# Patient Record
Sex: Male | Born: 1970 | Race: White | Hispanic: Yes | Marital: Married | Smoking: Current some day smoker
Health system: Southern US, Community
[De-identification: ages and names within clinical notes are randomized; demographics above are authoritative.]

---

## 2000-08-19 ENCOUNTER — Encounter (INDEPENDENT_AMBULATORY_CARE_PROVIDER_SITE_OTHER): Payer: Self-pay | Admitting: Specialist

## 2000-08-19 ENCOUNTER — Encounter: Payer: Self-pay | Admitting: General Surgery

## 2000-08-19 ENCOUNTER — Inpatient Hospital Stay (HOSPITAL_COMMUNITY): Admission: EM | Admit: 2000-08-19 | Discharge: 2000-08-20 | Payer: Self-pay | Admitting: Psychology

## 2005-03-18 ENCOUNTER — Ambulatory Visit: Payer: Self-pay | Admitting: Family Medicine

## 2005-03-24 ENCOUNTER — Ambulatory Visit: Payer: Self-pay | Admitting: *Deleted

## 2005-05-02 ENCOUNTER — Ambulatory Visit: Payer: Self-pay | Admitting: Family Medicine

## 2005-06-20 ENCOUNTER — Ambulatory Visit: Payer: Self-pay | Admitting: Family Medicine

## 2005-08-15 ENCOUNTER — Ambulatory Visit: Payer: Self-pay | Admitting: Family Medicine

## 2005-08-28 ENCOUNTER — Ambulatory Visit (HOSPITAL_COMMUNITY): Admission: RE | Admit: 2005-08-28 | Discharge: 2005-08-28 | Payer: Self-pay | Admitting: Family Medicine

## 2005-09-09 ENCOUNTER — Ambulatory Visit: Payer: Self-pay | Admitting: Family Medicine

## 2006-03-16 ENCOUNTER — Ambulatory Visit: Payer: Self-pay | Admitting: Internal Medicine

## 2006-09-21 ENCOUNTER — Ambulatory Visit: Payer: Self-pay | Admitting: Family Medicine

## 2006-12-16 ENCOUNTER — Ambulatory Visit: Payer: Self-pay | Admitting: Internal Medicine

## 2006-12-16 LAB — CONVERTED CEMR LAB
Alkaline Phosphatase: 79 units/L (ref 39–117)
Basophils Absolute: 0 10*3/uL (ref 0.0–0.1)
Creatinine, Ser: 0.76 mg/dL (ref 0.40–1.50)
Eosinophils Absolute: 0.1 10*3/uL (ref 0.0–0.7)
Eosinophils Relative: 2 % (ref 0–5)
Glucose, Bld: 62 mg/dL — ABNORMAL LOW (ref 70–99)
HCT: 44.7 % (ref 39.0–52.0)
Lymphs Abs: 2.2 10*3/uL (ref 0.7–3.3)
MCV: 88 fL (ref 78.0–100.0)
Platelets: 318 10*3/uL (ref 150–400)
RDW: 13.2 % (ref 11.5–14.0)
Sodium: 140 meq/L (ref 135–145)
Total Bilirubin: 0.5 mg/dL (ref 0.3–1.2)
Total Protein: 7.5 g/dL (ref 6.0–8.3)

## 2006-12-24 ENCOUNTER — Ambulatory Visit (HOSPITAL_COMMUNITY): Admission: RE | Admit: 2006-12-24 | Discharge: 2006-12-24 | Payer: Self-pay | Admitting: Internal Medicine

## 2007-01-06 ENCOUNTER — Ambulatory Visit: Payer: Self-pay | Admitting: Internal Medicine

## 2007-01-06 ENCOUNTER — Encounter (INDEPENDENT_AMBULATORY_CARE_PROVIDER_SITE_OTHER): Payer: Self-pay | Admitting: *Deleted

## 2007-01-11 ENCOUNTER — Ambulatory Visit (HOSPITAL_COMMUNITY): Admission: RE | Admit: 2007-01-11 | Discharge: 2007-01-11 | Payer: Self-pay | Admitting: Internal Medicine

## 2007-03-11 ENCOUNTER — Ambulatory Visit (HOSPITAL_COMMUNITY): Admission: RE | Admit: 2007-03-11 | Discharge: 2007-03-11 | Payer: Self-pay | Admitting: Internal Medicine

## 2008-05-01 ENCOUNTER — Ambulatory Visit: Payer: Self-pay | Admitting: Internal Medicine

## 2008-05-11 ENCOUNTER — Ambulatory Visit: Payer: Self-pay | Admitting: Internal Medicine

## 2008-05-11 LAB — CONVERTED CEMR LAB: Chlamydia, Swab/Urine, PCR: NEGATIVE

## 2009-01-15 ENCOUNTER — Ambulatory Visit: Payer: Self-pay | Admitting: Internal Medicine

## 2009-03-05 ENCOUNTER — Ambulatory Visit: Payer: Self-pay | Admitting: Internal Medicine

## 2009-04-02 ENCOUNTER — Ambulatory Visit: Payer: Self-pay | Admitting: Internal Medicine

## 2009-04-02 ENCOUNTER — Encounter: Payer: Self-pay | Admitting: Family Medicine

## 2009-04-02 LAB — CONVERTED CEMR LAB
Basophils Relative: 1 % (ref 0–1)
CO2: 25 meq/L (ref 19–32)
Chloride: 104 meq/L (ref 96–112)
HDL: 52 mg/dL (ref >39)
Hemoglobin: 14.7 g/dL (ref 13.0–17.0)
LDL Cholesterol: 124 mg/dL — ABNORMAL HIGH (ref 0–99)
Lymphocytes Relative: 41 % (ref 12–46)
Lymphs Abs: 2.1 10*3/uL (ref 0.7–4.0)
MCHC: 33.6 g/dL (ref 30.0–36.0)
Monocytes Absolute: 0.4 10*3/uL (ref 0.1–1.0)
Monocytes Relative: 7 % (ref 3–12)
Neutro Abs: 2.3 10*3/uL (ref 1.7–7.7)
Potassium: 4.4 meq/L (ref 3.5–5.3)
RBC: 4.96 M/uL (ref 4.22–5.81)
Sed Rate: 2 mm/hr (ref 0–16)
Sodium: 140 meq/L (ref 135–145)
Total CHOL/HDL Ratio: 3.8
Triglycerides: 98 mg/dL (ref <150)
VLDL: 20 mg/dL (ref 0–40)
WBC: 5 10*3/uL (ref 4.0–10.5)

## 2009-04-09 ENCOUNTER — Ambulatory Visit: Payer: Self-pay | Admitting: Internal Medicine

## 2009-05-14 ENCOUNTER — Ambulatory Visit: Payer: Self-pay | Admitting: Internal Medicine

## 2009-05-14 LAB — CONVERTED CEMR LAB
Albumin: 4.7 g/dL (ref 3.5–5.2)
Alkaline Phosphatase: 84 units/L (ref 39–117)
BUN: 12 mg/dL (ref 6–23)
Glucose, Bld: 111 mg/dL — ABNORMAL HIGH (ref 70–99)
HDL: 50 mg/dL (ref >39)
LDL Cholesterol: 130 mg/dL — ABNORMAL HIGH (ref 0–99)
Potassium: 4.4 meq/L (ref 3.5–5.3)
Triglycerides: 101 mg/dL (ref <150)

## 2009-11-02 ENCOUNTER — Ambulatory Visit (HOSPITAL_COMMUNITY): Admission: RE | Admit: 2009-11-02 | Discharge: 2009-11-02 | Payer: Self-pay | Admitting: Internal Medicine

## 2009-11-02 ENCOUNTER — Ambulatory Visit: Payer: Self-pay | Admitting: Family Medicine

## 2010-05-12 ENCOUNTER — Encounter: Payer: Self-pay | Admitting: Internal Medicine

## 2010-09-06 NOTE — Op Note (Signed)
[place]. [facility]  Patient:    Eric Schmidt, Eric Schmidt                     MRN: [id number] Proc. Date: 08/20/00 Adm. Date:  [id number] Disc. Date: [id number] Attending:  Chevis Pretty S                           Operative Report  PREOPERATIVE DIAGNOSIS:  Acute appendicitis.  POSTOPERATIVE DIAGNOSIS:  Acute appendicitis.  OPERATION PERFORMED:  Laparoscopic appendectomy.  SURGEON:  Chevis Pretty, M.D.  ANESTHESIA:  General endotracheal.  DESCRIPTION OF PROCEDURE:  After informed consent was obtained, the patient was brought to the operating room and placed in supine position on the operating room table.  After adequate induction of general anesthesia, the patients abdomen was prepped with Betadine and draped in the usual sterile manner.  A small vertically oriented superumbilical incision was made with a 15 blade knife after infiltrating this area with 0.25% Marcaine.  This incision was carried down through the subcutaneous tissue bluntly using Kelly clamp and army-navy retractors until the linea alba was identified.  The linea alba was incised with a 15 blade knife and each side was grasped with Kocher clamps and elevated.  The preperitoneal space was probed bluntly with a hemostat until the peritoneum was opened and access was gained to the abdominal cavity.  A finger was inserted through this hole to palpate the anterior abdominal wall and no apparent adhesions were noted.  A 0 Vicryl pursestring suture was placed in the fascia around this hole.  A Hasson cannula was then placed through this opening and anchored with the previously placed Vicryl pursestring stitch.  The abdomen was insufflated with carbon dioxide without difficulty.  A laparoscope was placed through the Hasson cannula and on initial inspection of the abdomen there were no abnormalities. The patient was placed in Trendelenburg position and tilted a little bit with his left side down. At this point  in looking in the right lower quadrant, the edge of what appeared to be an inflamed appendix could be visualized.  Another small transversely oriented incision was made in the superpubic area just to the left of midline after infiltrating this area with 0.25% Marcaine.  A 12 mm port was placed through this incision bluntly into the abdominal cavity under direct vision without difficulty.  Another area was also infiltrated with 0.25% Marcaine just below the umbilicus and to the left of midline.  A small stab incision was made with a 15 blade knife and a 5 mm trocar was placed bluntly through this incision into the abdominal cavity again under direct vision without difficulty.  A Glassman grasper was placed through the 12 mm trocar and used to elevate the appendix which did appear to be very large and inflamed, towards the anterior abdominal wall.  A Harmonic scalpel was placed through the 5 mm port and used to divide the mesoappendix.  This was done until the base of the appendix and cecum were identified and cleared of any fatty tissue.  At this point a Kingsley Spittle was placed through the 5 mm port and used to hold the appendix elevated towards the anterior abdominal wall.  An endoscopic stapler was then placed through the 12 mm port. The stapler was placed across the base of the appendix where it joins with the cecum. Visualization of this was excellent.  The stapler was then clamped and fired, stapling and  dividing the base of the appendix.  The endoscopic stapler was then removed and an endoscopic bag was placed through the 12 mm port. The appendix was placed within the endoscopic bag.  The camera was then moved to the 12 mm port and a gallbladder grasper was placed through the Hasson cannula and used to grasp the bag and remove it with the Hasson cannula through the superumbilical port.  The Hasson cannula was then replaced and the abdomen was inspected.  The appendiceal stump was hemostatic  and the staple line was intact and looked very good.  The abdomen was then irrigated with copious amounts of saline until the effluent was clear.  The ports were then all removed under direct vision and were all hemostatic.  The fascia of the superumbilical port was closed with the previously placed Vicryl pursestring suture.  The skin incisions were closed with interrupted 4-0 Monocryl subcuticular stitches.  Benzoin and Steri-Strips were applied.  The patient tolerated the procedure well.  At the end of the case all needle, sponge and instrument counts were correct.  The patient was awakened and taken to the recovery room in stable condition. DD:  08/20/00 TD:  08/20/00 Job: [id number]

## 2011-11-11 IMAGING — CR DG ANKLE COMPLETE 3+V*R*
3 series · 3 of 3 positions shown · non-contrast
Comparison: None.

CLINICAL DATA: History of ankle injury.  Pain.

RIGHT ANKLE - COMPLETE 3+ VIEW

[view not recorded (1 of 3)]
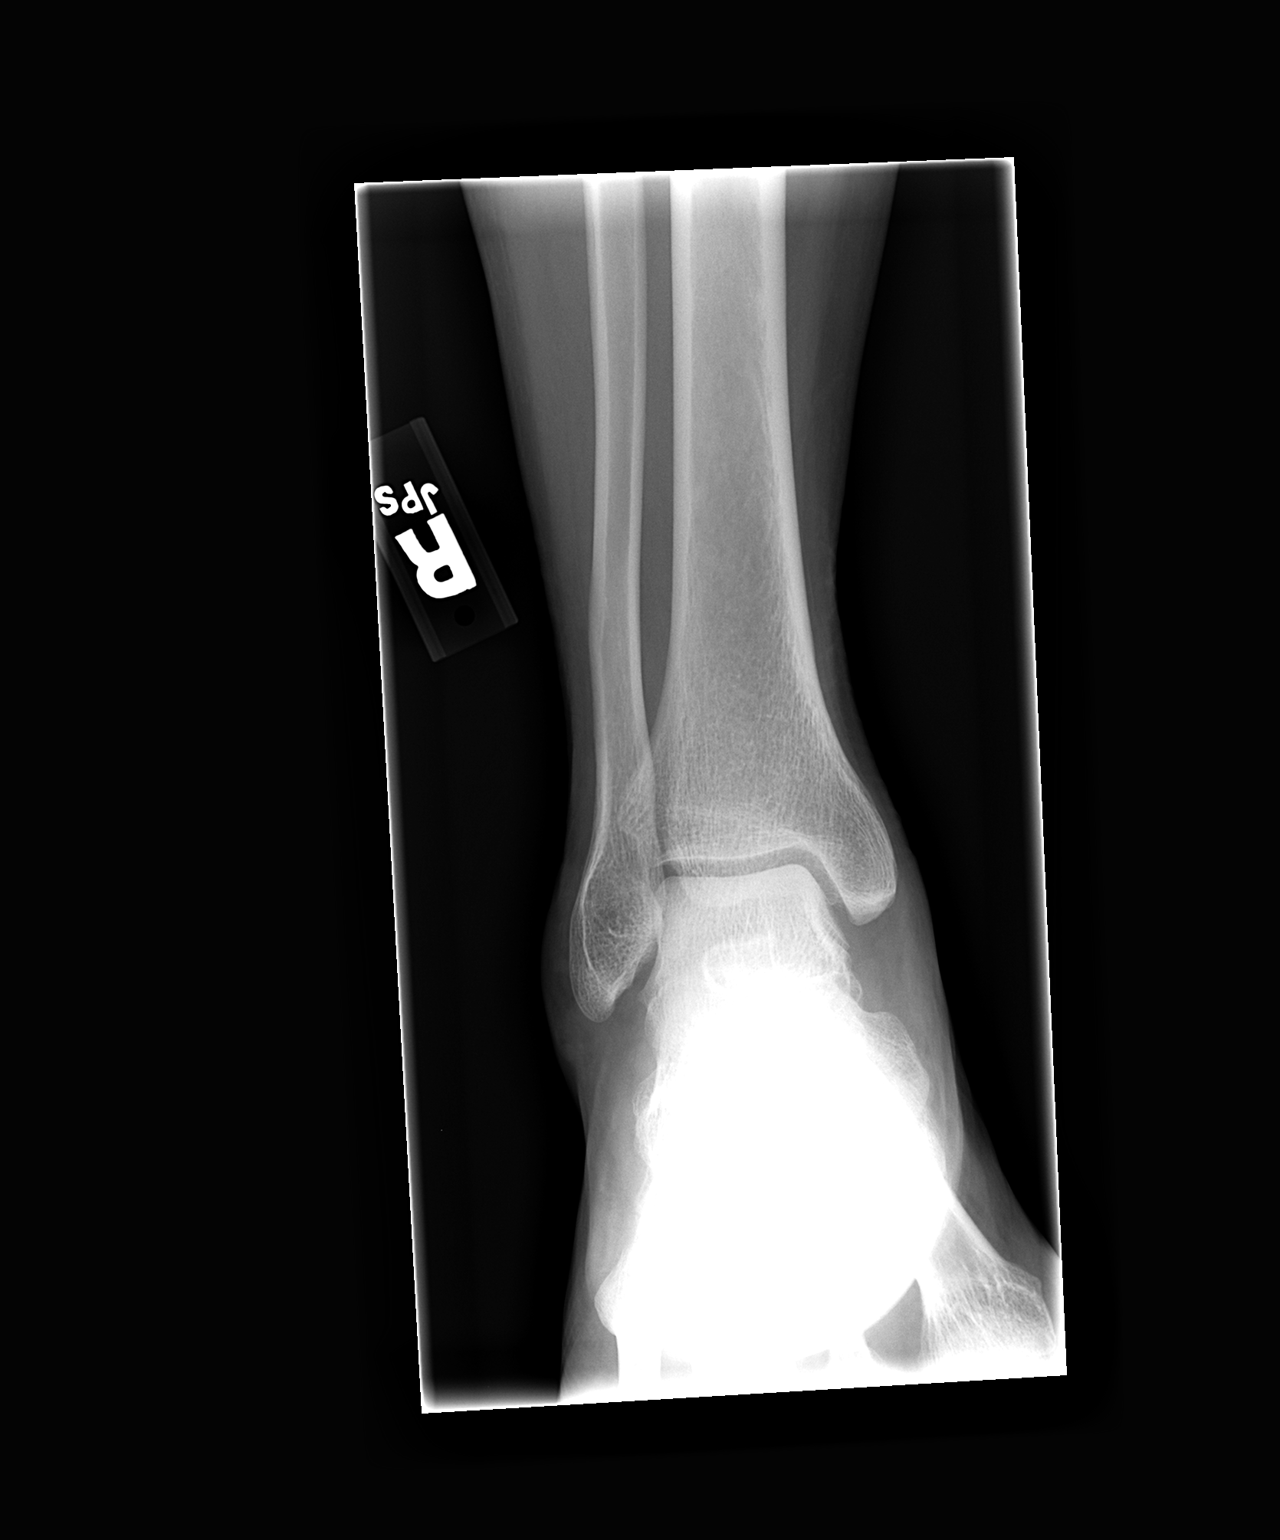

[view not recorded (2 of 3)]
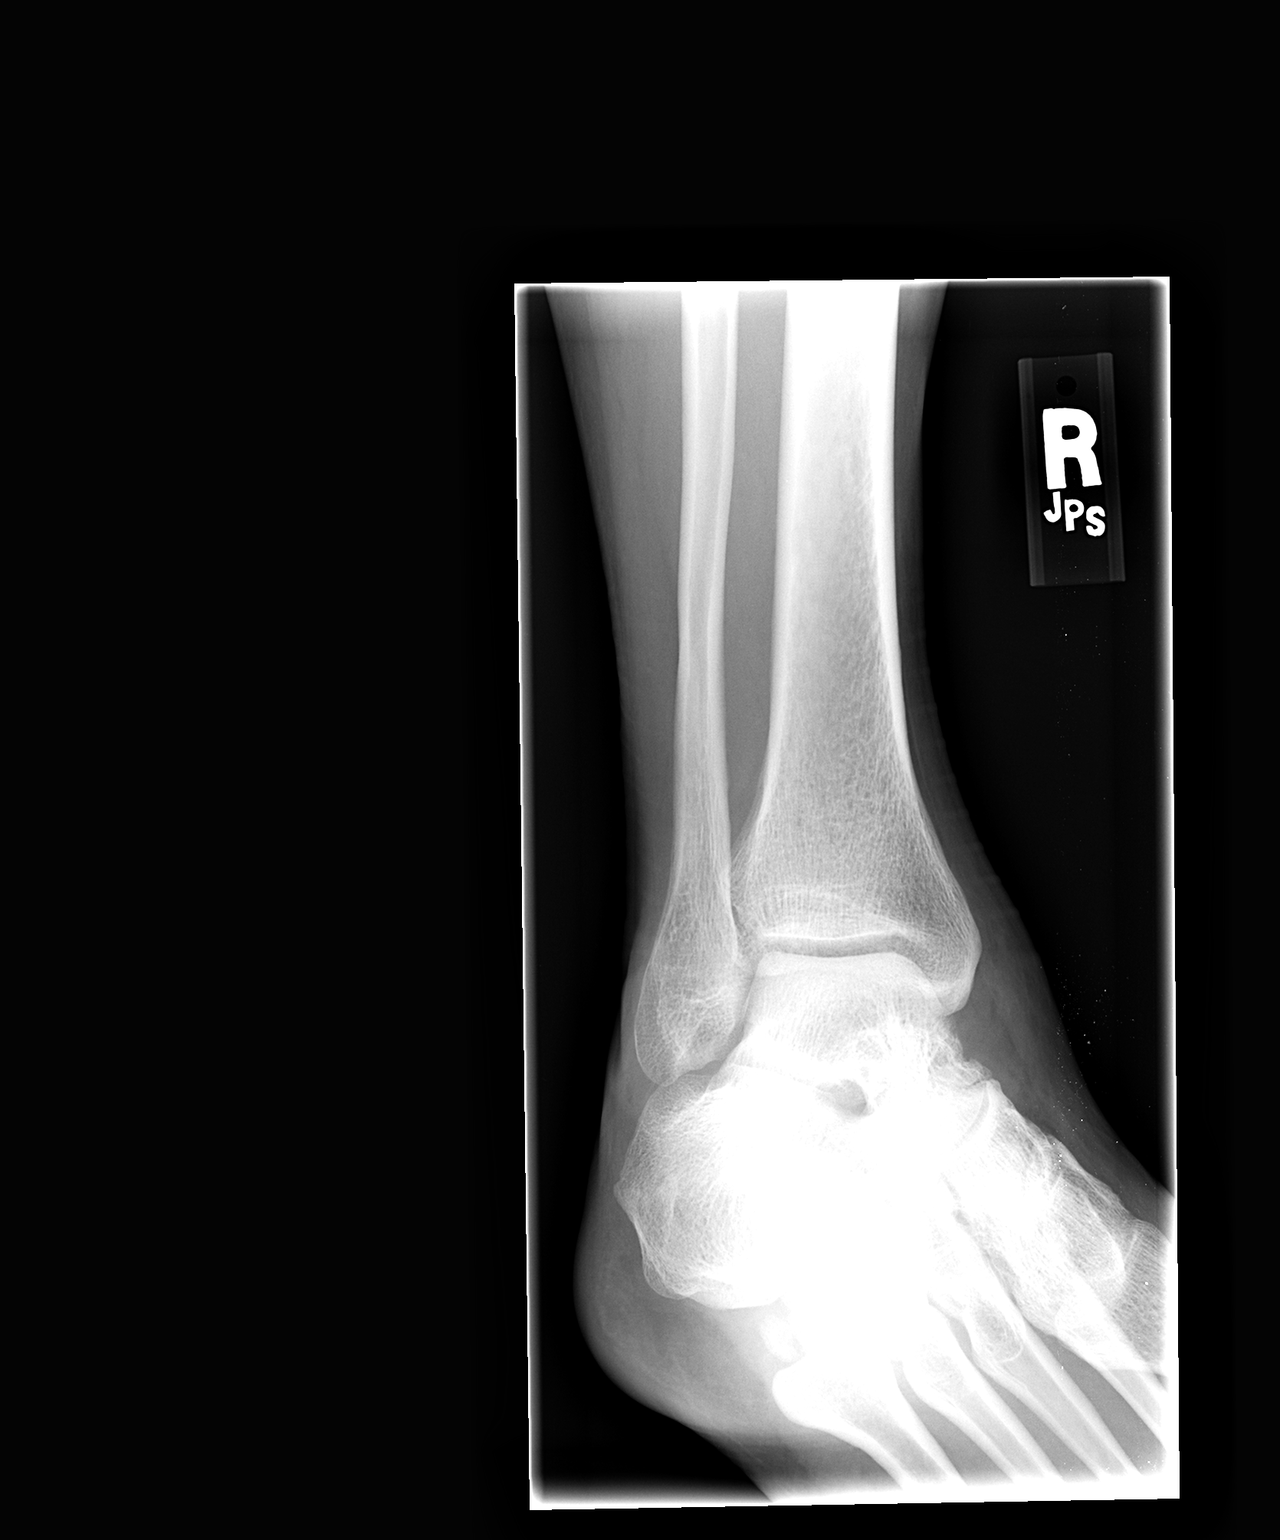

[view not recorded (3 of 3)]
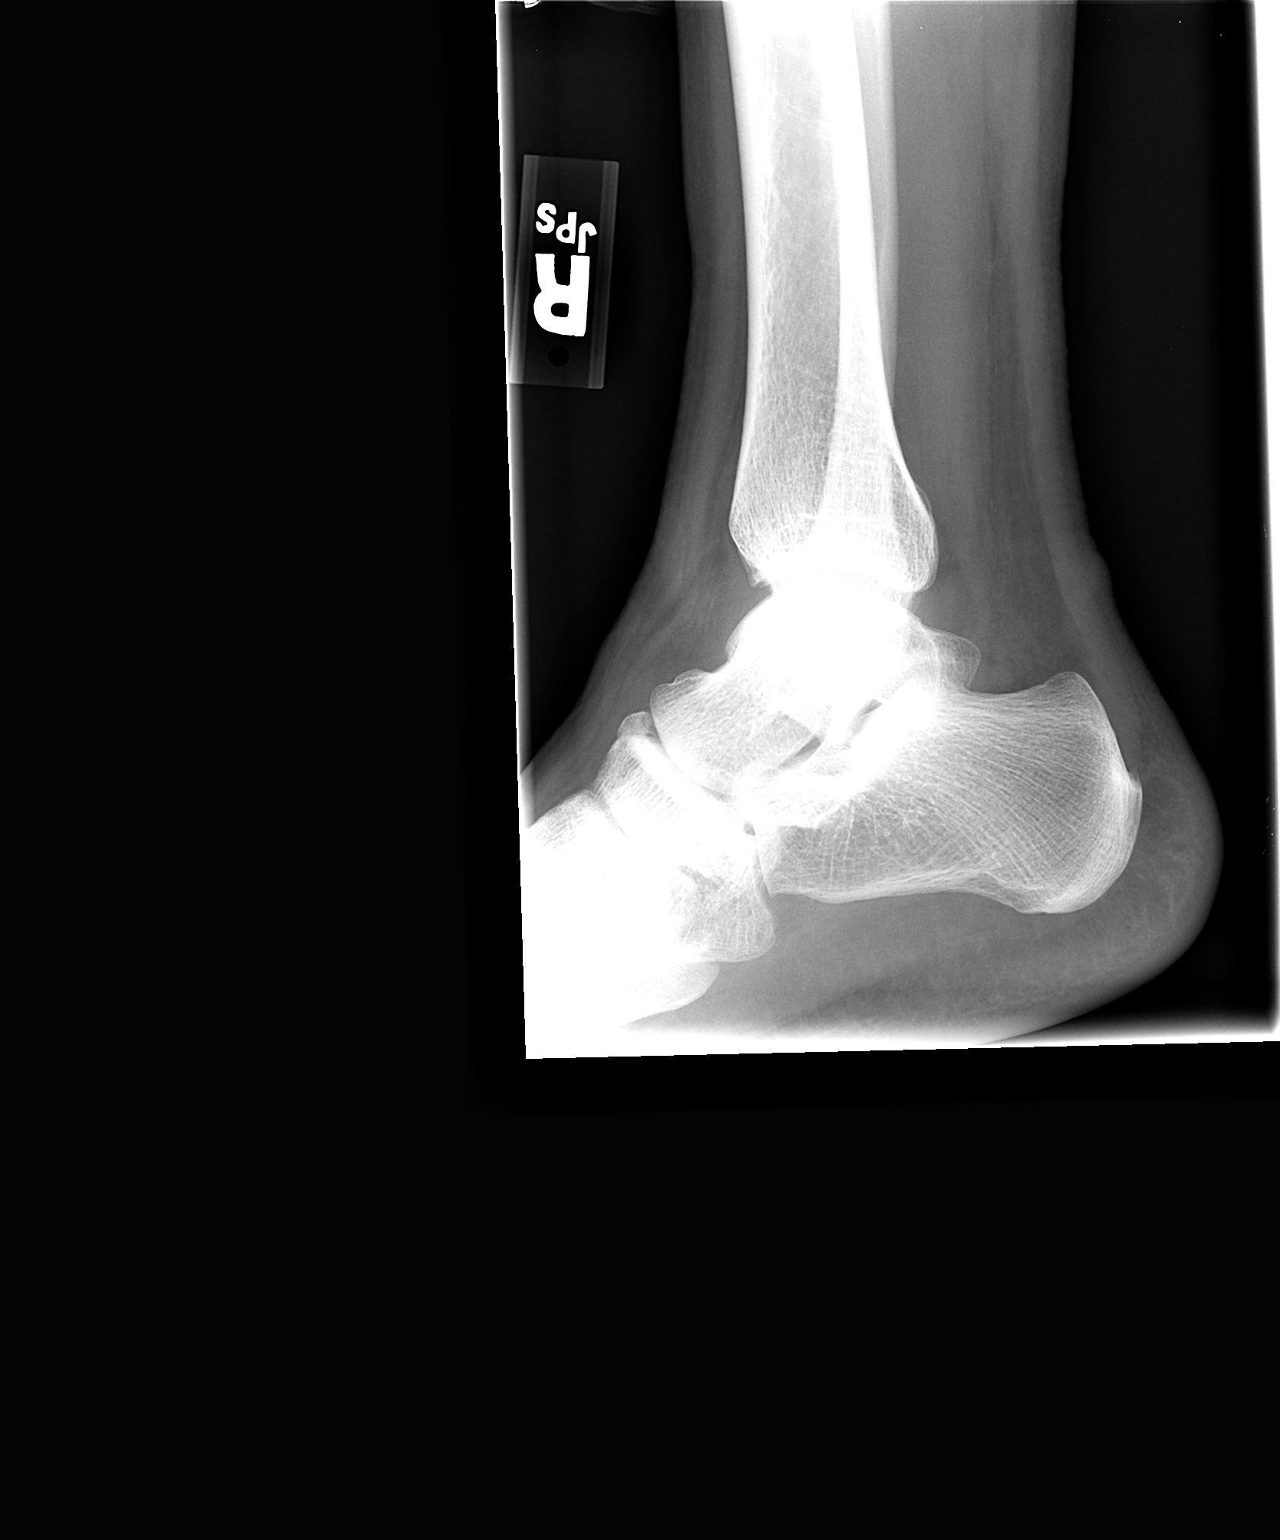

[3 of 3 positions shown; findings below may reference images not displayed]

FINDINGS: Mortise is preserved.  There is minimal degenerative
spurring of the distal anterior aspect of the tibia and the dorsal
proximal aspect of navicula. Alignment is normal.  Joint spaces are
preserved.  No fracture or dislocation is evident.  There is mild
soft tissue swelling.
IMPRESSION: No fracture or dislocation is evident.  Degenerative spurring as
described above.

## 2020-06-11 ENCOUNTER — Other Ambulatory Visit: Payer: Self-pay

## 2020-06-11 ENCOUNTER — Ambulatory Visit: Payer: Self-pay | Admitting: Physician Assistant

## 2020-06-11 VITALS — BP 131/87 | HR 76 | Temp 98.8°F | Resp 18 | Ht 65.0 in | Wt 163.0 lb

## 2020-06-11 DIAGNOSIS — L309 Dermatitis, unspecified: Secondary | ICD-10-CM

## 2020-06-11 MED ORDER — NYSTATIN-TRIAMCINOLONE 100000-0.1 UNIT/GM-% EX OINT: 1.0000 "application " | TOPICAL_OINTMENT | Freq: Two times a day (BID) | CUTANEOUS | 0 refills | Status: AC

## 2020-06-11 NOTE — Progress Notes (Signed)
Patient reports bruised areas on his skin 1 year ago on his left buttocks. Patient shares over the past few months he has noticed the abrasions show on his face and bilaterally on his arms. Patient has taken [facility] powder for body aches.

## 2020-06-11 NOTE — Patient Instructions (Signed)
You will use the cream on the spots twice a day until resolved.  I encourage you to not pick at them.  We will call you to schedule a follow-up appointment in 2 weeks for fasting labs and follow-up, and to help you with an appointment for financial assistance.  Please let us know there is anything else we can do for you in the meantime  Roney Jaffe, PA-C Physician Assistant [facility] [email or web address]    Tinea Versicolor  Tinea versicolor is a common fungal infection of the skin. It causes a rash that appears as light or dark patches on the skin. The rash most often occurs on the chest, back, neck, or upper arms. This condition is more common during warm weather. Other than affecting how your skin looks, tinea versicolor usually does not cause other problems. In most cases, the infection goes away in a few weeks with treatment. It may take a few months for the patches on your skin to return to your usual skin color. What are the causes? This condition occurs when a type of fungus that is normally present on the skin starts to overgrow. This fungus is a kind of yeast. The exact cause of the overgrowth is not known. This condition cannot be passed from one person to another (it is not contagious). What increases the risk? This condition is more likely to develop when certain factors are present, such as:  Heat and humidity.  Sweating too much.  Hormone changes.  Oily skin.  A weak disease-fighting system (immunesystem). What are the signs or symptoms? Symptoms of this condition include:  A rash of light or dark patches on your skin. The rash may have: ? Patches of tan or pink spots (on light skin). ? Patches of white or brown spots (on dark skin). ? Patches of skin that do not tan. ? Well-marked edges. ? Scales on the discolored areas.  Mild itching. How is this diagnosed? A health care provider can usually  diagnose this condition by looking at your skin. During the exam, he or she may use ultraviolet (UV) light to see how much of your skin has been affected. In some cases, a skin sample may be taken by scraping the rash. This sample will be viewed under a microscope to check for yeast overgrowth. How is this treated? Treatment for this condition may include:  Dandruff shampoo that is applied to the affected skin during showers or bathing.  Over-the-counter medicated skin cream, lotion, or soaps.  Prescription antifungal medicine in the form of skin cream or pills.  Medicine to help reduce itching. Follow these instructions at home:  Take over-the-counter and prescription medicines only as told by your health care provider.  Apply dandruff shampoo to the affected area if your health care provider told you to do that. You may be instructed to scrub the affected skin for several minutes each day.  Do not scratch the affected area of skin.  Avoid hot and humid conditions.  Do not use tanning booths.  Try to avoid sweating a lot. Contact a health care provider if:  Your symptoms get worse.  You have a fever.  You have redness, swelling, or pain at the site of your rash.  You have fluid or blood coming from your rash.  Your rash feels warm to the touch.  You have pus or a bad smell coming from your rash.  Your rash returns (recurs) after treatment. Summary  Tinea versicolor is a  common fungal infection of the skin. It causes a rash that appears as light or dark patches on the skin.  The rash most often occurs on the chest, back, neck, or upper arms.  A health care provider can usually diagnose this condition by looking at your skin.  Treatment may include applying shampoo to the skin and taking or applying medicines. This information is not intended to replace advice given to you by your health care provider. Make sure you discuss any questions you have with your health care  provider. Document Revised: 02/01/2020 Document Reviewed: 02/01/2020 Elsevier Patient Education  2021 [employer].

## 2020-06-11 NOTE — Progress Notes (Signed)
New Patient Office Visit  Subjective:  Patient ID: Eric Schmidt, male    DOB: 1970-07-27  Age: 50 y.o. MRN: [id number]  CC:  Chief Complaint  Patient presents with  . Abrasion    HPI Eric Schmidt reports that he started having spots on his forearms and a couple on his left leg and one on his left buttock.  Reports these have been present for the past year, states that he has used cortisone cream with some relief.  Reports that he does pick at them, although he denies pruritus.  Denies purulent, tenderness.  Denies any new detergents, lotions, fragrances, body washes, pets.  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   History reviewed. No pertinent past medical history.  History reviewed. No pertinent surgical history.  History reviewed. No pertinent family history.  Social History   Socioeconomic History  . Marital status: Married    Spouse name: Not on file  . Number of children: Not on file  . Years of education: Not on file  . Highest education level: Not on file  Occupational History  . Not on file  Tobacco Use  . Smoking status: Current Some Day Smoker  . Smokeless tobacco: Never Used  Substance and Sexual Activity  . Alcohol use: Not on file  . Drug use: Not on file  . Sexual activity: Not on file  Other Topics Concern  . Not on file  Social History Narrative  . Not on file   Social Determinants of Health   Financial Resource Strain: Not on file  Food Insecurity: Not on file  Transportation Needs: Not on file  Physical Activity: Not on file  Stress: Not on file  Social Connections: Not on file  Intimate Partner Violence: Not on file    ROS Review of Systems  Constitutional: Negative for chills and fever.  HENT: Negative.   Eyes: Negative.   Respiratory: Negative.   Cardiovascular: Negative.   Gastrointestinal: Negative.   Endocrine: Negative.   Genitourinary: Negative.   Musculoskeletal:  Negative.   Skin: Positive for rash.  Allergic/Immunologic: Negative.   Neurological: Negative.   Hematological: Negative.   Psychiatric/Behavioral: Negative.     Objective:   Today's Vitals: BP 131/87 (BP Location: Left Arm, Patient Position: Sitting, Cuff Size: Large)   Pulse 76   Temp 98.8 F (37.1 C) (Oral)   Resp 18   Ht 5\' 5"  (1.651 m)   Wt 163 lb (73.9 kg)   SpO2 99%   BMI 27.12 kg/m   Physical Exam Vitals and nursing note reviewed.  Constitutional:      Appearance: Normal appearance.  HENT:     Head: Atraumatic.     Right Ear: External ear normal.     Left Ear: External ear normal.     Nose: Nose normal.     Mouth/Throat:     Mouth: Mucous membranes are moist.     Pharynx: Oropharynx is clear.  Eyes:     Extraocular Movements: Extraocular movements intact.     Conjunctiva/sclera: Conjunctivae normal.     Pupils: Pupils are equal, round, and reactive to light.  Cardiovascular:     Rate and Rhythm: Normal rate.     Pulses: Normal pulses.  Pulmonary:     Effort: Pulmonary effort is normal.     Breath sounds: Normal breath sounds.  Musculoskeletal:        General: Normal range of motion.  Cervical back: Normal range of motion and neck supple.  Skin:    General: Skin is warm.     Comments: See photo of forearms   Neurological:     General: No focal deficit present.     Mental Status: He is alert and oriented to person, place, and time.  Psychiatric:        Mood and Affect: Mood normal.        Behavior: Behavior normal.        Thought Content: Thought content normal.        Judgment: Judgment normal.       Assessment & Plan:     1. Dermatitis Trial Mycolog, patient education given, patient to return to mobile unit in 2 weeks for follow-up and full physical.  Patient given information on [place] financial assistance, and will be given appointment to establish with primary care at that time. - nystatin-triamcinolone ointment (MYCOLOG); Apply  1 application topically 2 (two) times daily.  Dispense: 30 g; Refill: 0   I have reviewed the patient's medical history (PMH, PSH, Social History, Family History, Medications, and allergies) , and have been updated if relevant. I spent 30 minutes reviewing chart and  face to face time with patient.     Follow-up: Return in about 2 weeks (around 06/25/2020).   Kasandra Knudsen Mayers, PA-C

## 2020-06-12 ENCOUNTER — Telehealth: Payer: Self-pay

## 2020-06-12 DIAGNOSIS — L309 Dermatitis, unspecified: Secondary | ICD-10-CM | POA: Insufficient documentation

## 2020-06-12 NOTE — Telephone Encounter (Signed)
Reached out by phone to schedule 2 wk f/u and CAFA , OC appt @ [facility]. Patient is aware the MMU schedule has not been published and will be in the upcoming weeks.

## 2020-06-20 ENCOUNTER — Ambulatory Visit: Payer: Self-pay

## 2020-06-25 ENCOUNTER — Ambulatory Visit: Payer: Self-pay

## 2023-05-24 ENCOUNTER — Encounter (HOSPITAL_COMMUNITY): Payer: Self-pay | Admitting: *Deleted

## 2023-05-24 ENCOUNTER — Emergency Department (HOSPITAL_COMMUNITY)
Admission: EM | Admit: 2023-05-24 | Discharge: 2023-05-24 | Disposition: A | Discharge status: Home / Self Care | Payer: Self-pay | Attending: Emergency Medicine | Admitting: Emergency Medicine

## 2023-05-24 ENCOUNTER — Other Ambulatory Visit: Payer: Self-pay

## 2023-05-24 DIAGNOSIS — K029 Dental caries, unspecified: Secondary | ICD-10-CM | POA: Insufficient documentation

## 2023-05-24 DIAGNOSIS — Z7982 Long term (current) use of aspirin: Secondary | ICD-10-CM | POA: Insufficient documentation

## 2023-05-24 DIAGNOSIS — F17200 Nicotine dependence, unspecified, uncomplicated: Secondary | ICD-10-CM | POA: Insufficient documentation

## 2023-05-24 DIAGNOSIS — K0889 Other specified disorders of teeth and supporting structures: Secondary | ICD-10-CM

## 2023-05-24 MED ORDER — IBUPROFEN 600 MG PO TABS: 600.0000 mg | ORAL_TABLET | Freq: Four times a day (QID) | ORAL | 0 refills | Status: AC | PRN

## 2023-05-24 MED ORDER — ONDANSETRON 4 MG PO TBDP
8.0000 mg | ORAL_TABLET | Freq: Once | ORAL | Status: AC
  Administered 2023-05-24: 8 mg via ORAL
  Filled 2023-05-24: qty 2

## 2023-05-24 MED ORDER — OXYCODONE HCL 5 MG PO TABS: 5.0000 mg | ORAL_TABLET | Freq: Four times a day (QID) | ORAL | 0 refills | Status: AC | PRN

## 2023-05-24 MED ORDER — ACETAMINOPHEN 325 MG PO TABS: 650.0000 mg | ORAL_TABLET | Freq: Four times a day (QID) | ORAL | 0 refills | Status: AC | PRN

## 2023-05-24 MED ORDER — OXYCODONE-ACETAMINOPHEN 5-325 MG PO TABS
1.0000 | ORAL_TABLET | Freq: Once | ORAL | Status: AC
  Administered 2023-05-24: 1 via ORAL
  Filled 2023-05-24: qty 1

## 2023-05-24 MED ORDER — AMOXICILLIN-POT CLAVULANATE 875-125 MG PO TABS: 1.0000 | ORAL_TABLET | Freq: Two times a day (BID) | ORAL | 0 refills | Status: AC

## 2023-05-24 NOTE — ED Triage Notes (Signed)
The pt has had a toothache for 2 days  he ha shad problems with this tooth previously but a dentist saw  the pt and stopped the pain at  that time

## 2023-05-24 NOTE — Discharge Instructions (Addendum)
It was a pleasure caring for you today in the emergency department.  Please call dentist on Monday   Use Listerine mouthwash 3 times daily.  Use warm salt water gargle 3 times daily.  Use Orajel topical as needed per instructions on box.  Please follow-up with dentist.  Stop smoking  Please return to the emergency department for any worsening or worrisome symptoms.    RESOURCE GUIDE  Chronic Pain Problems: Contact [facility]  [phone] Patients need to be referred by their primary care doctor.  Insufficient Money for Medicine: Contact [employer]:  call [phone]  No Primary Care Doctor: Call [facility]  [phone] - can help you locate a primary care doctor that  accepts your insurance, provides certain services, etc. Physician Referral Service- [phone]  Agencies that provide inexpensive medical care: [facility]  [phone] [facility]  [phone] [facility]  [phone] [facility]  [phone] [facility]  [phone] [facility]  [phone]  Medicaid-accepting [facility] Providers: [facility]- [address]  [phone], Mon-Fri 9am-7pm, Sat 9am-1pm [facility]- [address] [place], [address] [place]  [phone] [facility]- [address], [address] [place]  [phone] [facility]- [address]  [phone] Renaye Rakers- [address] [place], [address], [phone]  Only accepts [place] patients after they have their name  applied to their card  Self Pay (no insurance) in [facility]: Sickle Cell Patients - [facility]  [address] [place], [phone] [facility]- [address] [place]  [phone]       [facility]- [address]       -     [facility]- see information above (Speak to [employer] if you do not have  insurance)       -  [facility]- [address],  [phone]       -  [facility]- [address], [phone]       -  Dr Julio Sicks-  [address], [place], [phone]       -  [facility] - [address], [phone]       -  [facility]- [address], [phone], also [address] [phone]       -     [facility]- [address] [place], [phone], 1st & 3rd Saturday         every month, 10am-1pm  -     [facility]   [address] [place], [place].   Phone:  [phone], Fax:  [phone]. Hours of Operation:  9 am - 6 pm, M-F.  -     [facility]   [address], [place]   Phone: [phone], Fax: [phone]. Hours of Operation:  8:30 am - 5:30 pm, M-F.    Dental Assistance If unable to pay or uninsured, contact:  [facility]. to become qualified for the adult dental clinic.  Patients with Medicaid: [facility] [phone] [address], [phone] [address] [phone]  If unable to pay, or uninsured, contact [facility] [phone] in [place], [phone] in [place])  to become qualified for the adult dental clinic  [facility] [address] [place], [place] [address] [phone] [email or web address]  Other Proofreader Services: [facility]- [address] [place], [place], [place], [address], [phone], Ext. [phone], 2nd and 4th Thursday of the month at 6:30am.  10 clients each day by appointment, can sometimes see walk-in patients if someone does not show for an appointment. [facility]- [address] [place], [place], [address], [phone] [facility] [address], [place], [place], [address], [phone] [facility] Department- [phone] [facility] Health Department- [phone] [facility] [phone]

## 2023-05-24 NOTE — ED Provider Notes (Signed)
[place] [facility] AT [facility] Provider Note  CSN: [id number] Arrival date & time: 05/24/23 4098  Chief Complaint(s) Dental Pain  HPI Eric Schmidt is a 53 y.o. male with past medical history as below, significant for dermatitis, tobacco use who presents to the ED with complaint of right dental pain  Pain ongoing the past few months, he is seen by dentist and told that he might have to have the tooth extracted.  Pain worsened over the past 24 for 8 hours, no some swelling to the right side of his face.  Some difficulty chewing.  No dysphagia or dysphonia.  No fevers, no vomiting.  Salt water gargles at home, Orajel.  Past Medical History History reviewed. No pertinent past medical history. Patient Active Problem List   Diagnosis Date Noted   Dermatitis 06/12/2020   Home Medication(s) Prior to Admission medications   Medication Sig Start Date End Date Taking? Authorizing Provider  acetaminophen (TYLENOL) 325 MG tablet Take 2 tablets (650 mg total) by mouth every 6 (six) hours as needed. 05/24/23  Yes Tanda Rockers A, DO  amoxicillin-clavulanate (AUGMENTIN) 875-125 MG tablet Take 1 tablet by mouth every 12 (twelve) hours. 05/24/23  Yes Tanda Rockers A, DO  ibuprofen (ADVIL) 600 MG tablet Take 1 tablet (600 mg total) by mouth every 6 (six) hours as needed. 05/24/23  Yes Tanda Rockers A, DO  oxyCODONE (ROXICODONE) 5 MG immediate release tablet Take 1 tablet (5 mg total) by mouth every 6 (six) hours as needed for severe pain (pain score 7-10). 05/24/23  Yes Sloan Leiter, DO  Aspirin-Salicylamide-Caffeine (ARTHRITIS STRENGTH BC POWDER PO) Take 1 tablet by mouth.    [provider]  nystatin-triamcinolone ointment (MYCOLOG) Apply 1 application topically 2 (two) times daily. 06/11/20   Mayers, Kasandra Knudsen, PA-C                                                                                                                                    Past Surgical History History  reviewed. No pertinent surgical history. Family History History reviewed. No pertinent family history.  Social History Social History   Tobacco Use   Smoking status: Every Day    Types: Cigarettes    Passive exposure: Never   Smokeless tobacco: Never  Substance Use Topics   Alcohol use: Yes   Drug use: Never   Allergies Patient has no known allergies.  Review of Systems Review of Systems  Constitutional:  Negative for chills and fever.  HENT:  Positive for dental problem. Negative for congestion.   Respiratory:  Negative for shortness of breath.   Cardiovascular:  Negative for chest pain.  Gastrointestinal:  Negative for abdominal pain and nausea.  Neurological:  Negative for headaches.  All other systems reviewed and are negative.   Physical Exam Vital Signs  I have reviewed the triage vital signs BP (!) 157/98   Pulse 95  Temp (!) 97.4 F (36.3 C)   Resp 18   Ht 5\' 5"  (1.651 m)   Wt 73.9 kg   SpO2 98%   BMI 27.11 kg/m  Physical Exam Vitals and nursing note reviewed.  Constitutional:      General: He is not in acute distress.    Appearance: Normal appearance. He is well-developed. He is not ill-appearing.  HENT:     Head: Normocephalic and atraumatic.     Right Ear: External ear normal.     Left Ear: External ear normal.     Nose: Nose normal.     Mouth/Throat:     Mouth: Mucous membranes are moist.     Dentition: Abnormal dentition. Dental tenderness present.     Tongue: Tongue does not deviate from midline.     Pharynx: Oropharynx is clear. Uvula midline. No oropharyngeal exudate or uvula swelling.      Comments: Dental caries noted, no drooling stridor or trismus.  Posterior oropharynx clear.  Uvula is midline.  No angioedema.  No evidence of Ludwig's angina Eyes:     General: No scleral icterus.       Right eye: No discharge.        Left eye: No discharge.  Cardiovascular:     Rate and Rhythm: Normal rate.  Pulmonary:     Effort: Pulmonary  effort is normal. No respiratory distress.     Breath sounds: No stridor.  Abdominal:     General: Abdomen is flat. There is no distension.     Tenderness: There is no guarding.  Musculoskeletal:        General: No deformity.     Cervical back: No rigidity.  Skin:    General: Skin is warm and dry.     Coloration: Skin is not cyanotic, jaundiced or pale.  Neurological:     Mental Status: He is alert and oriented to person, place, and time.     GCS: GCS eye subscore is 4. GCS verbal subscore is 5. GCS motor subscore is 6.  Psychiatric:        Speech: Speech normal.        Behavior: Behavior normal. Behavior is cooperative.     ED Results and Treatments Labs (all labs ordered are listed, but only abnormal results are displayed) Labs Reviewed - No data to display                                                                                                                        Radiology No results found.  Pertinent labs & imaging results that were available during my care of the patient were reviewed by me and considered in my medical decision making (see MDM for details).  Medications Ordered in ED Medications  oxyCODONE-acetaminophen (PERCOCET/ROXICET) 5-325 MG per tablet 1 tablet (1 tablet Oral Given 05/24/23 0307)  ondansetron (ZOFRAN-ODT) disintegrating tablet 8 mg (8 mg Oral Given 05/24/23 0306)  Procedures Procedures  (including critical care time)  Medical Decision Making / ED Course    Medical Decision Making:    Eric Schmidt is a 53 y.o. male with past medical history as below, significant for dermatitis, tobacco use who presents to the ED with complaint of right dental pain. The complaint involves an extensive differential diagnosis and also carries with it a high risk of complications and morbidity.  Serious etiology was  considered. Ddx includes but is not limited to: Dental abscess, dental caries, cellulitis, etc.  Complete initial physical exam performed, notably the patient was in no distress.    Reviewed and confirmed nursing documentation for past medical history, family history, social history.  Vital signs reviewed.         Brief summary: 53 year old male with history as above here with right side dental pain.  He does not require translator.  His exam is reassuring.  Does have multiple dental caries.  No evidence of easily drainable dental abscess.  No evidence of angioedema or Ludwig's angina.  No facial cellulitis.  No drooling stridor or trismus.  Tolerant p.o.  Feeling better after analgesia given in triage.  Will start Augmentin for possible concurrent infection, analgesia for home.  Supportive care.  Follow-up dentist on Monday.  The patient improved significantly and was discharged in stable condition. Detailed discussions were had with the patient/guardian regarding current findings, and need for close f/u with PCP or on call doctor. The patient/guardian has been instructed to return immediately if the symptoms worsen in any way for re-evaluation. Patient/guardian verbalized understanding and is in agreement with current care plan. All questions answered prior to discharge.                  Additional history obtained: -Additional history obtained from family -External records from outside source obtained and reviewed including: Chart review including previous notes, labs, imaging, consultation notes including  pdmop   Lab Tests: na  EKG   EKG Interpretation Date/Time:    Ventricular Rate:    PR Interval:    QRS Duration:    QT Interval:    QTC Calculation:   R Axis:      Text Interpretation:           Imaging Studies ordered: na   Medicines ordered and prescription drug management: Meds ordered this encounter  Medications   oxyCODONE-acetaminophen  (PERCOCET/ROXICET) 5-325 MG per tablet 1 tablet    Refill:  0   ondansetron (ZOFRAN-ODT) disintegrating tablet 8 mg   oxyCODONE (ROXICODONE) 5 MG immediate release tablet    Sig: Take 1 tablet (5 mg total) by mouth every 6 (six) hours as needed for severe pain (pain score 7-10).    Dispense:  10 tablet    Refill:  0   ibuprofen (ADVIL) 600 MG tablet    Sig: Take 1 tablet (600 mg total) by mouth every 6 (six) hours as needed.    Dispense:  30 tablet    Refill:  0   acetaminophen (TYLENOL) 325 MG tablet    Sig: Take 2 tablets (650 mg total) by mouth every 6 (six) hours as needed.    Dispense:  36 tablet    Refill:  0   amoxicillin-clavulanate (AUGMENTIN) 875-125 MG tablet    Sig: Take 1 tablet by mouth every 12 (twelve) hours.    Dispense:  14 tablet    Refill:  0    -I have reviewed the patients home medicines and have made adjustments  as needed   Consultations Obtained: na   Cardiac Monitoring: Continuous pulse oximetry interpreted by myself, 99% on RA.    Social Determinants of Health:  Diagnosis or treatment significantly limited by social determinants of health: current smoker Counseled patient for approximately 3 minutes regarding smoking cessation. Discussed risks of smoking and how they applied and affected their visit here today. Patient not ready to quit at this time, however will follow up with their primary doctor when they are.   CPT code: 82956: intermediate counseling for smoking cessation ,    Reevaluation: After the interventions noted above, I reevaluated the patient and found that they have improved  Co morbidities that complicate the patient evaluation History reviewed. No pertinent past medical history.    Dispostion: Disposition decision including need for hospitalization was considered, and patient discharged from emergency department.    Final Clinical Impression(s) / ED Diagnoses Final diagnoses:  Pain, dental  Dental caries         Sloan Leiter, DO 05/24/23 [phone]
